# Patient Record
Sex: Male | Born: 1995 | Race: White | Hispanic: No | Marital: Single | Smoking: Current every day smoker
Health system: Southern US, Community
[De-identification: ages and names within clinical notes are randomized; demographics above are authoritative.]

## PROBLEM LIST (undated history)

## (undated) DIAGNOSIS — F909 Attention-deficit hyperactivity disorder, unspecified type: Secondary | ICD-10-CM

---
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
## 2019-07-02 ENCOUNTER — Other Ambulatory Visit: Payer: Self-pay

## 2019-07-02 ENCOUNTER — Emergency Department (HOSPITAL_COMMUNITY)
Admission: EM | Admit: 2019-07-02 | Discharge: 2019-07-02 | Disposition: A | Discharge status: Home / Self Care | Payer: Self-pay | Attending: Emergency Medicine | Admitting: Emergency Medicine

## 2019-07-02 ENCOUNTER — Encounter (HOSPITAL_COMMUNITY): Payer: Self-pay

## 2019-07-02 DIAGNOSIS — F909 Attention-deficit hyperactivity disorder, unspecified type: Secondary | ICD-10-CM | POA: Insufficient documentation

## 2019-07-02 DIAGNOSIS — J029 Acute pharyngitis, unspecified: Secondary | ICD-10-CM | POA: Insufficient documentation

## 2019-07-02 DIAGNOSIS — K047 Periapical abscess without sinus: Secondary | ICD-10-CM | POA: Insufficient documentation

## 2019-07-02 DIAGNOSIS — F17210 Nicotine dependence, cigarettes, uncomplicated: Secondary | ICD-10-CM | POA: Insufficient documentation

## 2019-07-02 DIAGNOSIS — Z20822 Contact with and (suspected) exposure to covid-19: Secondary | ICD-10-CM | POA: Insufficient documentation

## 2019-07-02 LAB — GROUP A STREP BY PCR: Group A Strep by PCR: NOT DETECTED

## 2019-07-02 LAB — SARS CORONAVIRUS 2 (TAT 6-24 HRS): SARS Coronavirus 2: NEGATIVE

## 2019-07-02 MED ORDER — AMOXICILLIN-POT CLAVULANATE 875-125 MG PO TABS: 1.0000 | ORAL_TABLET | Freq: Two times a day (BID) | ORAL | 0 refills | Status: AC

## 2019-07-02 MED ORDER — ACETAMINOPHEN 500 MG PO TABS
1000.0000 mg | ORAL_TABLET | Freq: Once | ORAL | Status: AC
  Administered 2019-07-02: 1000 mg via ORAL
  Filled 2019-07-02: qty 2

## 2019-07-02 NOTE — ED Provider Notes (Signed)
[facility]-EMERGENCY DEPT Provider Note   CSN: [id number] Arrival date & time: 07/02/19  1007     History Chief Complaint  Patient presents with  . Dental Pain    [facility] is a 24 y.o. male.  HPI Patient is a 24 year old male with no significant past medical history presented today for left lower dental pain (contrary to triage note it is left lower dental pain left upper).  Patient states his pain began on Saturday and has been constant worsening since with associated fevers and chills states that he started having a sore throat yesterday as well.  Patient scribes the pain as severe, 9/10, worse with eating worse with chewing.  States that he is still able to tolerate p.o. does endorse body aches and fatigue as well.   Patient denies any known sick contacts but is interested in being tested for Covid today.  Patient has used Excedrin with some relief.  States that he took his temperature at home was approximately 100.  States he been feeling fatigued and tired.  Denies any cough, chest pain, shortness of breath, wheezing, abdominal pain.  Patient states that he does not have a dentist but does brush twice daily.      Past Medical History:  Diagnosis Date  . ADHD (attention deficit hyperactivity disorder)     Patient Active Problem List   Diagnosis Date Noted  . Conduct disorder 05/09/2012    History reviewed. No pertinent surgical history.     Family History  Problem Relation Age of Onset  . Diabetes Mother   . Depression Mother   . Drug abuse Mother   . Depression Maternal Grandmother   . Cancer Maternal Grandfather   . Drug abuse Father     Social History   Tobacco Use  . Smoking status: Current Every Day Smoker    Packs/day: 1.00    Years: 0.50    Pack years: 0.50    Types: Cigarettes  Substance Use Topics  . Alcohol use: Yes    Comment: Vodka 1/2 bottle in past/now every couple weeks  . Drug use: Yes    Frequency: 6.0  times per week    Types: Hydrocodone, Benzodiazepines    Home Medications Prior to Admission medications   Medication Sig Start Date End Date Taking? Authorizing Provider  mirtazapine (REMERON) 15 MG tablet Take 1 tablet (15 mg total) by mouth at bedtime. 05/16/12   Winson, Louie Bun, NP    Allergies    Nickel  Review of Systems   Review of Systems  Constitutional: Positive for chills, fatigue and fever.  HENT: Positive for dental problem. Negative for congestion.   Eyes: Negative for pain.  Respiratory: Negative for cough and shortness of breath.   Cardiovascular: Negative for chest pain and leg swelling.  Gastrointestinal: Negative for abdominal pain and vomiting.  Genitourinary: Negative for dysuria and flank pain.  Musculoskeletal: Positive for myalgias.  Skin: Negative for rash.  Neurological: Negative for dizziness and headaches.    Physical Exam Updated Vital Signs BP 116/84   Pulse (!) 114   Temp 100.1 F (37.8 C)   Resp 16   SpO2 95%   Physical Exam Vitals and nursing note reviewed.  Constitutional:      General: He is not in acute distress.    Appearance: Normal appearance. He is not ill-appearing.  HENT:     Head: Normocephalic and atraumatic.     Mouth/Throat:     Mouth: Mucous membranes  are moist.     Tonsils: Tonsillar exudate present.      Comments: Generally poor dentition.  Full range of motion of tongue.  Posterior pharynx with mild erythema and right-sided tonsillar exudate.  Tonsil sizes within normal limits. Eyes:     General: No scleral icterus.       Right eye: No discharge.        Left eye: No discharge.     Conjunctiva/sclera: Conjunctivae normal.  Neck:     Comments: Trachea midline.  Managing secretions without difficulty.  No difficulty phonating or swallowing. Cardiovascular:     Rate and Rhythm: Regular rhythm. Tachycardia present.     Comments: Tachycardic at 106 Pulmonary:     Effort: Pulmonary effort is normal.     Breath  sounds: No stridor.  Abdominal:     Tenderness: There is no abdominal tenderness. There is no right CVA tenderness, left CVA tenderness, guarding or rebound.  Lymphadenopathy:     Cervical: Cervical adenopathy (Bilateral mild anterior cervical lymphadenopathy) present.  Skin:    Capillary Refill: Capillary refill takes less than 2 seconds.  Neurological:     Mental Status: He is alert and oriented to person, place, and time. Mental status is at baseline.     ED Results / Procedures / Treatments   Labs (all labs ordered are listed, but only abnormal results are displayed) Labs Reviewed  GROUP A STREP BY PCR  SARS CORONAVIRUS 2 (TAT 6-24 HRS)    EKG None  Radiology No results found.  Procedures Procedures (including critical care time)  Medications Ordered in ED Medications  acetaminophen (TYLENOL) tablet 1,000 mg (has no administration in time range)    ED Course  I have reviewed the triage vital signs and the nursing notes.  Pertinent labs & imaging results that were available during my care of the patient were reviewed by me and considered in my medical decision making (see chart for details).    MDM Rules/Calculators/A&P                     Patient is a healthy 24 year old male presented today with low-grade fever and left-sided lower dental pain with subjective fevers and chills and sore throat.  Physical exam significant for periapical abscess left lower gumline.  Because of patient sore throat and trace exudate with cervical lymphadenopathy and will swab for strep.  We will also test patient for Covid.  This will be a send out lab.  Made recommendations for quarantining in the meantime.  Doubt retropharyngeal abscess, peritonsillar abscess.  Doubt Ludwig's angina he has no tenderness sublingually and forage motion of tongue.  There is no gross swelling on the exterior of his face.  Doubt mumps or sialoadenitis.  Strep swab is negative.  Covid swab pending.   Initial tachycardia of 114 resolved without intervention other than Tylenol.  Patient is well-appearing at time of discharge.  Prescribed Augmentin for periapical abscess.  The medical records were personally reviewed by myself. I personally reviewed all lab results and interpreted all imaging studies and either concurred with their official read or contacted radiology for clarification.   This patient appears reasonably screened and I doubt any other medical condition requiring further workup, evaluation, or treatment in the ED at this time prior to discharge.   Patient's vitals are WNL apart from vital sign abnormalities discussed above, patient is in NAD, and able to ambulate in the ED at their baseline and able to tolerate PO.  Pain has been managed or a plan has been made for home management and has no complaints prior to discharge. Patient is comfortable with above plan and for discharge at this time. All questions were answered prior to disposition. Results from the ER workup discussed with the patient face to face and all questions answered to the best of my ability. The patient is safe for discharge with strict return precautions. Patient appears safe for discharge with appropriate follow-up. Conveyed my impression with the patient and they voiced understanding and are agreeable to plan.   An After Visit Summary was printed and given to the patient.  Portions of this note were generated with [employer]. Dictation errors may occur despite best attempts at proofreading.    [facility] was evaluated in Emergency Department on 07/02/2019 for the symptoms described in the history of present illness. He was evaluated in the context of the global COVID-19 pandemic, which necessitated consideration that the patient might be at risk for infection with the SARS-CoV-2 virus that causes COVID-19. Institutional protocols and algorithms that pertain to the evaluation of patients at  risk for COVID-19 are in a state of rapid change based on information released by regulatory bodies including the CDC and federal and state organizations. These policies and algorithms were followed during the patient's care in the ED.  Final Clinical Impression(s) / ED Diagnoses Final diagnoses:  Periapical abscess  Pharyngitis, unspecified etiology    Rx / DC Orders ED Discharge Orders    None       Tedd Sias, [place] 07/02/19 1241    Pattricia Boss, MD 07/02/19 1510

## 2019-07-02 NOTE — Discharge Instructions (Addendum)
Please gargle warm salt water, use throat lozenges, stay hydrated.  Please follow-up with dentist. Please take antibiotics as prescribed.

## 2019-07-02 NOTE — ED Triage Notes (Signed)
Pt states that last night he started having upper left side dental pain . Pt states this caused him to have fever/chills and unable to eat.

## 2022-10-08 ENCOUNTER — Emergency Department: Payer: Medicaid Other

## 2022-10-08 ENCOUNTER — Emergency Department
Admission: EM | Admit: 2022-10-08 | Discharge: 2022-10-08 | Disposition: A | Discharge status: Home / Self Care | Payer: Medicaid Other | Attending: Emergency Medicine | Admitting: Emergency Medicine

## 2022-10-08 ENCOUNTER — Other Ambulatory Visit: Payer: Self-pay

## 2022-10-08 DIAGNOSIS — M25572 Pain in left ankle and joints of left foot: Secondary | ICD-10-CM | POA: Diagnosis present

## 2022-10-08 DIAGNOSIS — S93492A Sprain of other ligament of left ankle, initial encounter: Secondary | ICD-10-CM | POA: Diagnosis not present

## 2022-10-08 DIAGNOSIS — M898X6 Other specified disorders of bone, lower leg: Secondary | ICD-10-CM

## 2022-10-08 DIAGNOSIS — M899 Disorder of bone, unspecified: Secondary | ICD-10-CM | POA: Diagnosis not present

## 2022-10-08 DIAGNOSIS — W132XXA Fall from, out of or through roof, initial encounter: Secondary | ICD-10-CM | POA: Diagnosis not present

## 2022-10-08 MED ORDER — KETOROLAC TROMETHAMINE 10 MG PO TABS
10.0000 mg | ORAL_TABLET | Freq: Once | ORAL | Status: AC
  Administered 2022-10-08: 10 mg via ORAL
  Filled 2022-10-08: qty 1

## 2022-10-08 MED ORDER — NAPROXEN 500 MG PO TABS: 500.0000 mg | ORAL_TABLET | Freq: Two times a day (BID) | ORAL | 0 refills | Status: AC

## 2022-10-08 MED ORDER — ACETAMINOPHEN 500 MG PO TABS
1000.0000 mg | ORAL_TABLET | Freq: Once | ORAL | Status: AC
  Administered 2022-10-08: 1000 mg via ORAL
  Filled 2022-10-08: qty 2

## 2022-10-08 NOTE — ED Provider Notes (Signed)
[facility] Provider Note    Event Date/Time   First MD Initiated Contact with Patient 10/08/22 1536     (approximate)   History   Chief Complaint: Trauma (Patient was working on a roof when he lost his footing and slid off, falling between 20-25 ft; No loss of consciousness, no back pain, only complaint is LEFT ankle/foot pain; Placed in c-collar per mechanism of injury; Patient was able to get up and drive himself home then realized he was having pain)   HPI  [facility] is a 27 y.o. male with no significant past medical history who was in his usual state of health today, working on a metal roof when he slipped, falling onto his back and sliding off the roof and falling to the ground.  He landed on both feet in an upright position, and afterward developed left lateral foot pain.  Nonradiating.  No other injuries.  He did not hit his head or lose consciousness, denies any back pain.     Physical Exam   Triage Vital Signs: ED Triage Vitals  Enc Vitals Group     BP 10/08/22 1533 132/75     Pulse Rate 10/08/22 1533 71     Resp 10/08/22 1533 19     Temp 10/08/22 1533 97.7 F (36.5 C)     Temp Source 10/08/22 1533 Oral     SpO2 10/08/22 1533 98 %     Weight --      Height --      Head Circumference --      Peak Flow --      Pain Score 10/08/22 1527 8     Pain Loc --      Pain Edu? --      Excl. in GC? --     Most recent vital signs: Vitals:   10/08/22 1533  BP: 132/75  Pulse: 71  Resp: 19  Temp: 97.7 F (36.5 C)  SpO2: 98%    General: Awake, no distress.  CV:  Good peripheral perfusion.  Normal distal pulses.  Normal capillary refill. Resp:  Normal effort.  Abd:  No distention.  Other:  No midline spinal tenderness.  Full range of motion in C-spine.  There is tenderness and mild swelling at the insertion of the left ATFL ligament which reproduces his pain.  Pain is also reproduced by inversion of the left foot.  No other bony  tenderness, no tenderness at Lisfranc's joint, no pain with lateral stress.   ED Results / Procedures / Treatments   Labs (all labs ordered are listed, but only abnormal results are displayed) Labs Reviewed - No data to display   EKG    RADIOLOGY X-ray right foot and ankle interpreted by me, negative for fracture.  Incidental distal tibia exostosis noted.  Radiology report reviewed.  CT head, C-spine, T-spine, L-spine all unremarkable.   PROCEDURES:  Procedures   MEDICATIONS ORDERED IN ED: Medications  ketorolac (TORADOL) tablet 10 mg (has no administration in time range)  acetaminophen (TYLENOL) tablet 1,000 mg (has no administration in time range)     IMPRESSION / MDM / ASSESSMENT AND PLAN / ED COURSE  I reviewed the triage vital signs and the nursing notes.  DDx: Foot fracture, ankle fracture, spinal fracture, ankle sprain  Patient's presentation is most consistent with acute complicated illness / injury requiring diagnostic workup.  Patient presents with fall from a significant height landing onto both feet.  Only complaint is left  foot pain.  Exam wise only finding is clinically apparent left ankle sprain at the ATFL.  Counseled on RICE therapy.  Will apply Ace wrap, provide crutches and NSAIDs.  Counseled patient on the incidental finding of exostosis of the left distal tibia, and he reports he had noticed the palpable bony protuberance which has been there for years and does not bother him.       FINAL CLINICAL IMPRESSION(S) / ED DIAGNOSES   Final diagnoses:  Sprain of anterior talofibular ligament of left ankle, initial encounter  Exostosis of left tibia     Rx / DC Orders   ED Discharge Orders          Ordered    naproxen (NAPROSYN) 500 MG tablet  2 times daily with meals        10/08/22 1624             Note:  This document was prepared using Dragon voice recognition software and may include unintentional dictation errors.   Sharman Cheek, MD 10/08/22 [phone]

## 2022-10-08 NOTE — ED Triage Notes (Signed)
Patient was working on a roof when he lost his footing and slid off, falling between 20-25 ft; No loss of consciousness, no back pain, only complaint is LEFT ankle/foot pain; Placed in c-collar per mechanism of injury; Patient was able to get up and drive himself home then realized he was having pain

## 2023-06-22 ENCOUNTER — Emergency Department (HOSPITAL_COMMUNITY)
Admission: EM | Admit: 2023-06-22 | Discharge: 2023-06-22 | Disposition: A | Discharge status: Home / Self Care | Payer: Medicaid Other | Attending: Emergency Medicine | Admitting: Emergency Medicine

## 2023-06-22 ENCOUNTER — Encounter (HOSPITAL_COMMUNITY): Payer: Self-pay

## 2023-06-22 ENCOUNTER — Other Ambulatory Visit: Payer: Self-pay

## 2023-06-22 DIAGNOSIS — R1012 Left upper quadrant pain: Secondary | ICD-10-CM | POA: Insufficient documentation

## 2023-06-22 LAB — CBC WITH DIFFERENTIAL/PLATELET
Abs Immature Granulocytes: 0 10*3/uL (ref 0.00–0.07)
Basophils Absolute: 0 10*3/uL (ref 0.0–0.1)
Basophils Relative: 1 %
Eosinophils Absolute: 0.1 10*3/uL (ref 0.0–0.5)
Eosinophils Relative: 2 %
HCT: 46.1 % (ref 39.0–52.0)
Hemoglobin: 15.5 g/dL (ref 13.0–17.0)
Immature Granulocytes: 0 %
Lymphocytes Relative: 24 %
Lymphs Abs: 1.3 10*3/uL (ref 0.7–4.0)
MCH: 33.3 pg (ref 26.0–34.0)
MCHC: 33.6 g/dL (ref 30.0–36.0)
MCV: 98.9 fL (ref 80.0–100.0)
Monocytes Absolute: 0.7 10*3/uL (ref 0.1–1.0)
Monocytes Relative: 14 %
Neutro Abs: 3.2 10*3/uL (ref 1.7–7.7)
Neutrophils Relative %: 59 %
Platelets: 288 10*3/uL (ref 150–400)
RBC: 4.66 MIL/uL (ref 4.22–5.81)
RDW: 12.5 % (ref 11.5–15.5)
WBC: 5.3 10*3/uL (ref 4.0–10.5)
nRBC: 0 % (ref 0.0–0.2)

## 2023-06-22 LAB — URINALYSIS, ROUTINE W REFLEX MICROSCOPIC
Bacteria, UA: NONE SEEN
Bilirubin Urine: NEGATIVE
Glucose, UA: NEGATIVE mg/dL
Ketones, ur: NEGATIVE mg/dL
Leukocytes,Ua: NEGATIVE
Nitrite: NEGATIVE
Protein, ur: NEGATIVE mg/dL
Specific Gravity, Urine: 1.021 (ref 1.005–1.030)
pH: 6 (ref 5.0–8.0)

## 2023-06-22 LAB — COMPREHENSIVE METABOLIC PANEL
ALT: 15 U/L (ref 0–44)
AST: 17 U/L (ref 15–41)
Albumin: 4.3 g/dL (ref 3.5–5.0)
Alkaline Phosphatase: 72 U/L (ref 38–126)
Anion gap: 9 (ref 5–15)
BUN: 18 mg/dL (ref 6–20)
CO2: 26 mmol/L (ref 22–32)
Calcium: 9.8 mg/dL (ref 8.9–10.3)
Chloride: 105 mmol/L (ref 98–111)
Creatinine, Ser: 0.83 mg/dL (ref 0.61–1.24)
GFR, Estimated: >60 mL/min (ref >60)
Glucose, Bld: 92 mg/dL (ref 70–99)
Potassium: 4.2 mmol/L (ref 3.5–5.1)
Sodium: 140 mmol/L (ref 135–145)
Total Bilirubin: 0.4 mg/dL (ref 0.0–1.2)
Total Protein: 7 g/dL (ref 6.5–8.1)

## 2023-06-22 LAB — LIPASE, BLOOD: Lipase: 27 U/L (ref 11–51)
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
## 2023-06-22 NOTE — Discharge Instructions (Addendum)
Follow-up with a primary care doctor.  Your workup was reassuring today.  I did not feel a hernia.

## 2023-06-22 NOTE — ED Triage Notes (Addendum)
Pt reports having a knot in his left upper abdomen that is causing him intermittent pain x 8 months.
